# Patient Record
Sex: Male | Born: 1965 | Race: White | Hispanic: No | Marital: Married | Smoking: Current every day smoker
Health system: Southern US, Community
[De-identification: ages and names within clinical notes are randomized; demographics above are authoritative.]

## PROBLEM LIST (undated history)

## (undated) DIAGNOSIS — E119 Type 2 diabetes mellitus without complications: Secondary | ICD-10-CM

## (undated) DIAGNOSIS — E785 Hyperlipidemia, unspecified: Secondary | ICD-10-CM

## (undated) HISTORY — DX: Type 2 diabetes mellitus without complications: E11.9

## (undated) HISTORY — DX: Hyperlipidemia, unspecified: E78.5

## (undated) HISTORY — PX: TONSILLECTOMY: SHX5217

---

## 1998-02-11 HISTORY — PX: PILONIDAL CYST EXCISION: SHX744

## 2011-09-27 ENCOUNTER — Emergency Department: Payer: Self-pay | Admitting: *Deleted

## 2011-09-28 LAB — COMPREHENSIVE METABOLIC PANEL
Anion Gap: 9 (ref 7–16)
Calcium, Total: 8.9 mg/dL (ref 8.5–10.1)
Chloride: 103 mmol/L (ref 98–107)
Co2: 26 mmol/L (ref 21–32)
EGFR (African American): >60
EGFR (Non-African Amer.): >60
Osmolality: 284 (ref 275–301)
Potassium: 3.7 mmol/L (ref 3.5–5.1)
SGOT(AST): 49 U/L — ABNORMAL HIGH (ref 15–37)
SGPT (ALT): 67 U/L (ref 12–78)
Sodium: 138 mmol/L (ref 136–145)
Total Protein: 7 g/dL (ref 6.4–8.2)

## 2011-09-28 LAB — CBC
HGB: 16.1 g/dL (ref 13.0–18.0)
MCV: 88 fL (ref 80–100)
Platelet: 145 10*3/uL — ABNORMAL LOW (ref 150–440)
RDW: 14.2 % (ref 11.5–14.5)
WBC: 8.9 10*3/uL (ref 3.8–10.6)

## 2011-09-28 LAB — TROPONIN I
Troponin-I: 0.04 ng/mL
Troponin-I: <0.02 ng/mL

## 2011-09-28 LAB — CK TOTAL AND CKMB (NOT AT ARMC): CK, Total: 85 U/L (ref 35–232)

## 2015-06-26 ENCOUNTER — Other Ambulatory Visit: Payer: Self-pay | Admitting: Internal Medicine

## 2015-06-26 ENCOUNTER — Ambulatory Visit
Admission: RE | Admit: 2015-06-26 | Discharge: 2015-06-26 | Disposition: A | Discharge status: Home / Self Care | Payer: Self-pay | Source: Ambulatory Visit | Attending: Internal Medicine | Admitting: Internal Medicine

## 2015-06-26 DIAGNOSIS — R05 Cough: Secondary | ICD-10-CM | POA: Insufficient documentation

## 2015-06-26 DIAGNOSIS — R059 Cough, unspecified: Secondary | ICD-10-CM

## 2016-07-11 ENCOUNTER — Encounter: Payer: Self-pay | Admitting: *Deleted

## 2016-07-16 ENCOUNTER — Encounter: Payer: Self-pay | Admitting: *Deleted

## 2016-07-22 ENCOUNTER — Ambulatory Visit: Payer: Self-pay | Admitting: General Surgery

## 2016-08-27 ENCOUNTER — Encounter: Payer: Self-pay | Admitting: *Deleted

## 2016-12-05 ENCOUNTER — Encounter: Payer: Self-pay | Admitting: General Surgery

## 2016-12-05 ENCOUNTER — Ambulatory Visit (INDEPENDENT_AMBULATORY_CARE_PROVIDER_SITE_OTHER): Payer: BLUE CROSS/BLUE SHIELD | Admitting: General Surgery

## 2016-12-05 VITALS — BP 132/70 | HR 70 | Resp 14 | Ht 66.0 in | Wt 267.0 lb

## 2016-12-05 DIAGNOSIS — Z1211 Encounter for screening for malignant neoplasm of colon: Secondary | ICD-10-CM | POA: Diagnosis not present

## 2016-12-05 NOTE — Progress Notes (Signed)
Patient ID: Ralph Klein, male   DOB: 03/29/1965, 51 y.o.   MRN: [id number]  Chief Complaint  Patient presents with  . Colonoscopy    HPI Ralph Klein is a 50 y.o. male Here today for a evaluation of a screening colonoscopy. Patient states no GI problems at this time. Moves his bowels daily. No family history of colon cancer.  HPI  Past Medical History:  Diagnosis Date  . Diabetes ([place])   . Hyperlipidemia     Past Surgical History:  Procedure Laterality Date  . PILONIDAL CYST EXCISION  2000  . TONSILLECTOMY      Family History  Problem Relation Age of Onset  . Ovarian cancer Mother   . Lung cancer Brother     Social History Social History  Substance Use Topics  . Smoking status: Current Every Day Smoker    Types: Cigarettes  . Smokeless tobacco: Never Used  . Alcohol use Yes    Allergies  Allergen Reactions  . Cod [facility] Allergy]   . Codeine Other (See Comments)    Current Outpatient Prescriptions  Medication Sig Dispense Refill  . lisinopril (PRINIVIL,ZESTRIL) 10 MG tablet   0  . metFORMIN (GLUCOPHAGE) 500 MG tablet   3  . pravastatin (PRAVACHOL) 40 MG tablet   3  . sitaGLIPtin (JANUVIA) 25 MG tablet Take 25 mg by mouth daily.     No current facility-administered medications for this visit.     Review of Systems Review of Systems  Constitutional: Negative.   Respiratory: Negative.   Cardiovascular: Negative.     Blood pressure 132/70, pulse 70, resp. rate 14, height 5' 6"  (1.676 m), weight 267 lb (121.1 kg).  Physical Exam Physical Exam  Constitutional: He is oriented to person, place, and time. He appears well-developed and well-nourished.  Cardiovascular: Normal rate, regular rhythm and normal heart sounds.   Pulmonary/Chest: Effort normal and breath sounds normal.  Abdominal: Soft. Bowel sounds are normal. There is no tenderness.  Neurological: He is alert and oriented to person, place, and time.  Skin: Skin is warm and dry.    Data  Reviewed Laboratory studies dated 07/03/2016 showed a fasting blood sugar 160. Creatinine 0.78 with an estimate EGFR 105. Elevated SGPT at 52. Hemoglobin A1c 7.4 (improved from 9.1. Normal electrolytes.  Assessment    Candidate for colon cancer screening.    Plan    Options for screening reviewed including 1) fecal occult blood, 2) Cologuard and 3) colonoscopy.  Sensitivity of all measures reviewed. All her acceptable screening test.    Patient want to do the Cologuard, form and order faxed. The patient is aware to call back for any questions or concerns.  The patient is aware it'll take about 2 weeks to get a report after the stool sample was submitted. If positive colonoscopy would be encouraged.   HPI, Physical Exam, Assessment and Plan have been scribed under the direction and in the presence of Hervey Ard, MD.  [name], CMA  I have completed the exam and reviewed the above documentation for accuracy and completeness.  I agree with the above.  Haematologist has been used and any errors in dictation or transcription are unintentional.  Hervey Ard, M.D., F.A.C.S.   Ralph Klein 12/05/2016, 7:53 PM

## 2016-12-05 NOTE — Patient Instructions (Signed)
Patient want to do the color-guard. The patient is aware to call back for any questions or concerns.

## 2016-12-19 LAB — COLOGUARD

## 2017-01-07 ENCOUNTER — Telehealth: Payer: Self-pay | Admitting: *Deleted

## 2017-01-07 NOTE — Telephone Encounter (Signed)
-----   Message from Ralph MayotteJeffrey W Byrnett, MD sent at 01/06/2017  7:37 PM EST ----- Notify patient Cologuard test results negative. He should have a repeat sample with his PCP in three years.

## 2017-01-07 NOTE — Telephone Encounter (Signed)
Notified patient as instructed, patient agrees. Patient aware to have a repeat sample in 3 years with his primary care physician

## 2017-01-08 ENCOUNTER — Encounter: Payer: Self-pay | Admitting: General Surgery

## 2017-02-12 ENCOUNTER — Encounter: Payer: Self-pay | Admitting: General Surgery

## 2017-02-13 ENCOUNTER — Encounter: Payer: Self-pay | Admitting: General Surgery

## 2017-08-21 IMAGING — DX DG CHEST 2V
2 series · 2 of 2 positions shown · non-contrast
Comparison: 09/28/2011

CLINICAL DATA: Cough x 1 month or more, occasional yellowish
phlegm, no shob, no pain or tightness, smoker, no COPD, no asthma,
no other lung/heart problems/ surgery per pt.

EXAM:
CHEST  2 VIEW

[chest lat]
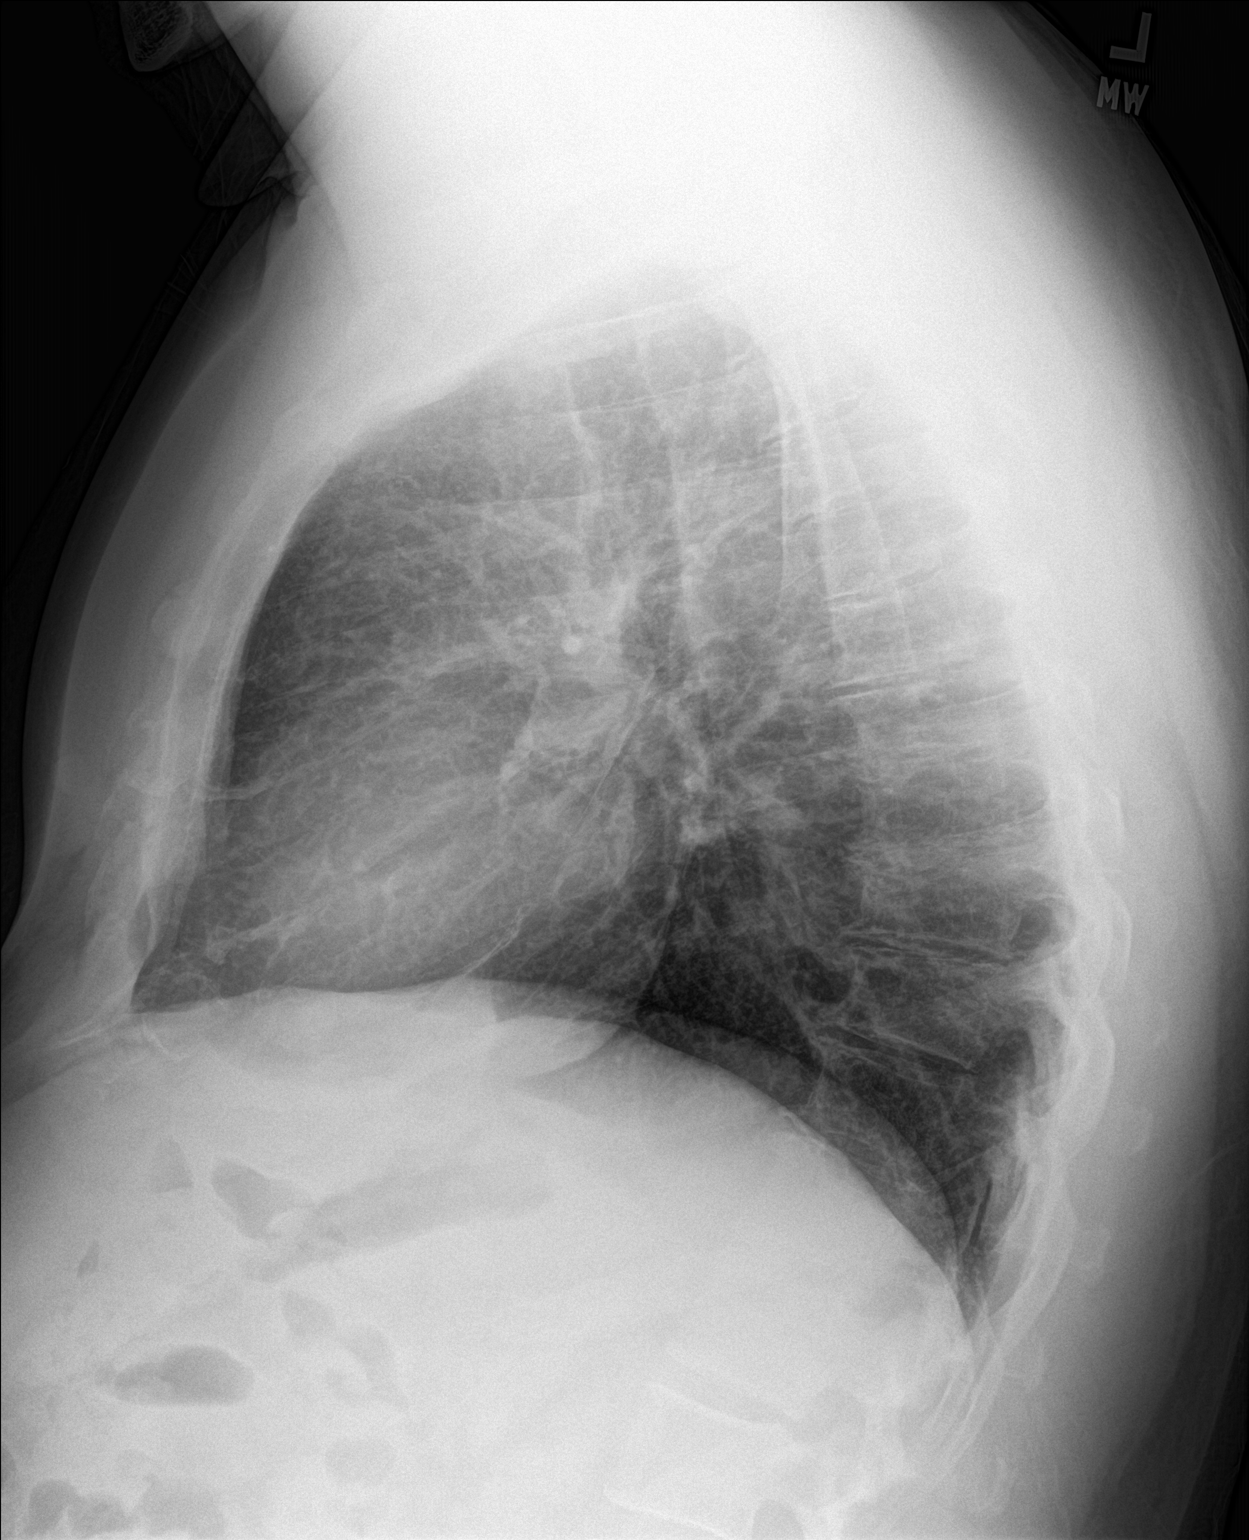

[chest pa]
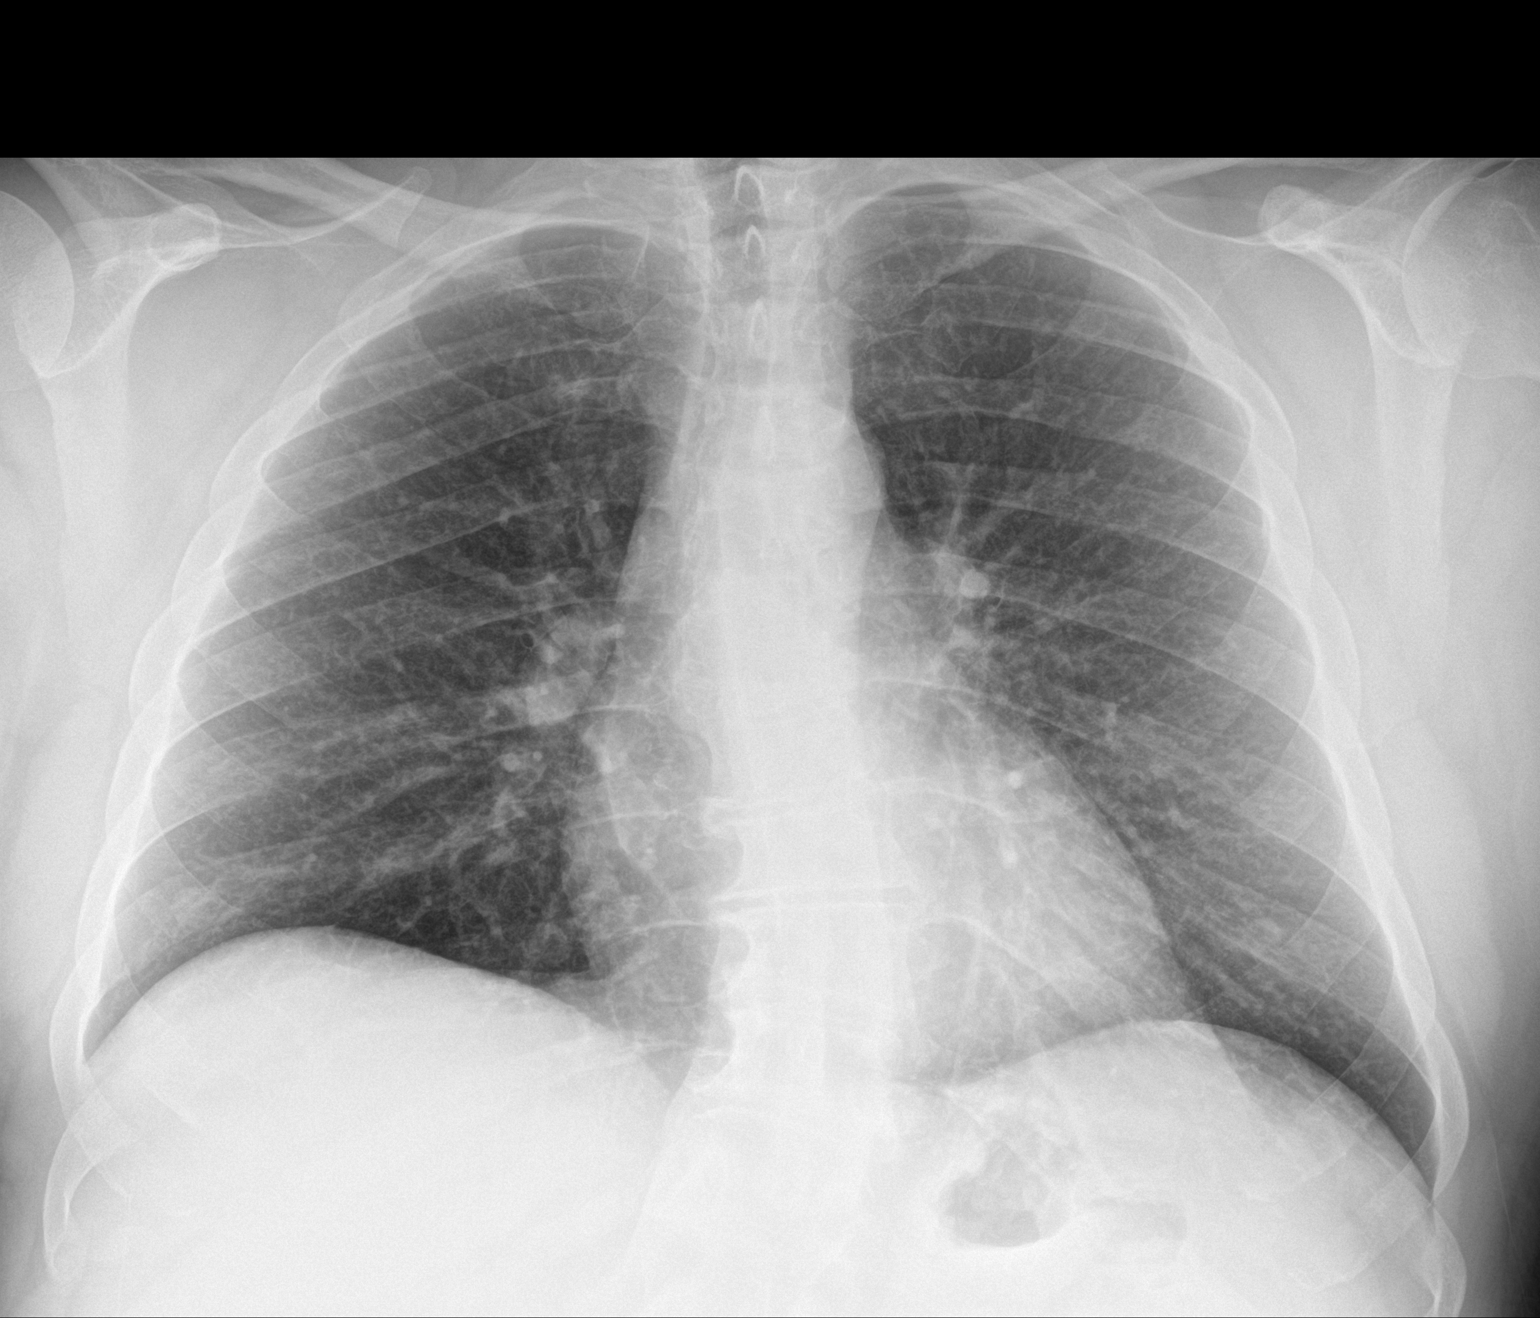

[2 of 2 positions shown; findings below may reference images not displayed]

FINDINGS: Cardiac silhouette is normal in size. No mediastinal or hilar masses
or evidence of adenopathy.

Clear lungs.  No pleural effusion or pneumothorax.

Bony thorax is demineralized but intact.
IMPRESSION: No active cardiopulmonary disease.

## 2020-02-10 LAB — EXTERNAL GENERIC LAB PROCEDURE: COLOGUARD: NEGATIVE

## 2023-06-03 LAB — EXTERNAL GENERIC LAB PROCEDURE: COLOGUARD: NEGATIVE

## 2023-06-09 ENCOUNTER — Ambulatory Visit: Payer: Self-pay | Admitting: Cardiovascular Disease
